# Patient Record
Sex: Male | Born: 2000 | Race: White | Hispanic: No | Marital: Single | Smoking: Never smoker
Health system: Southern US, Community
[De-identification: ages and names within clinical notes are randomized; demographics above are authoritative.]

## PROBLEM LIST (undated history)

## (undated) DIAGNOSIS — F419 Anxiety disorder, unspecified: Secondary | ICD-10-CM

## (undated) DIAGNOSIS — F909 Attention-deficit hyperactivity disorder, unspecified type: Secondary | ICD-10-CM

## (undated) HISTORY — DX: Attention-deficit hyperactivity disorder, unspecified type: F90.9

## (undated) HISTORY — DX: Anxiety disorder, unspecified: F41.9

---
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
## 2020-01-04 ENCOUNTER — Ambulatory Visit (INDEPENDENT_AMBULATORY_CARE_PROVIDER_SITE_OTHER): Payer: 59 | Admitting: Psychiatry

## 2020-01-04 ENCOUNTER — Encounter (HOSPITAL_COMMUNITY): Payer: Self-pay | Admitting: Psychiatry

## 2020-01-04 ENCOUNTER — Other Ambulatory Visit: Payer: Self-pay

## 2020-01-04 DIAGNOSIS — F330 Major depressive disorder, recurrent, mild: Secondary | ICD-10-CM

## 2020-01-04 DIAGNOSIS — R4589 Other symptoms and signs involving emotional state: Secondary | ICD-10-CM

## 2020-01-04 NOTE — Progress Notes (Signed)
Virtual Visit via Video Note  I connected with Howard Herman on 01/04/20 at 12:00 PM EST by a video enabled telemedicine application and verified that I am speaking with the correct person using two identifiers.  Location: Patient: Patient Home Provider: Home Office   History of Present Illness: MDD and Difficulty Coping  Observations/Objective: Counselor met with Patient in the context of Group Therapy, via Webex. Counselor prompted Patient to share updates on coping skill application and individual therapeutic process in management of mental health. Howard Herman reports that he is maintaining mood and daily functioning well over the past week. He continues with individual therapy and medication management. Howard Herman reports that he is coping better with the recent loss of relationship. Counselor prompted Patient to share areas of concern, challenges and identify barriers to meeting current goals. Howard Herman shared about a difficult situation he is in, requesting feedback on how to best interact with others in a healthy and respectful manner. He noted progress made in decision making and less over-thinking. Topics covered: handling difficult news, optimistic actions, communicating needs, setting boundaries, positive self-talk, and self-advocacy. Patient engaged in discussion, provided feedback for others within the group and took note of additional strategies reviewed and discussed.   Assessment and Plan: Counselor recommends client continue following treatment plan goals, following crisis plan and following up with behavioral health and medical providers as needed. Patient is welcome to join group at next session.   Follow Up Instructions: Counselor to provide link for next session via Webex platform. The patient was advised to call back or seek an in-person evaluation if the symptoms worsen or if the condition fails to improve as anticipated.  I provided 60 minutes of non-face-to-face time during this  encounter.   Lise Auer, LCSW

## 2020-05-02 ENCOUNTER — Encounter (HOSPITAL_COMMUNITY): Payer: Self-pay | Admitting: Psychiatry

## 2020-05-02 ENCOUNTER — Other Ambulatory Visit: Payer: Self-pay

## 2020-05-02 ENCOUNTER — Ambulatory Visit (INDEPENDENT_AMBULATORY_CARE_PROVIDER_SITE_OTHER): Payer: 59 | Admitting: Psychiatry

## 2020-05-02 DIAGNOSIS — F330 Major depressive disorder, recurrent, mild: Secondary | ICD-10-CM

## 2020-05-02 NOTE — Progress Notes (Signed)
Virtual Visit via Video Note  I connected with Howard Herman on 05/02/20 at 12:00 PM EDT by a video enabled telemedicine application and verified that I am speaking with the correct person using two identifiers.  Location: Patient: Patient Home Provider: Home Office   History of Present Illness: MDD  Observations/Objective: Counselor met with Patient in the context of Group Therapy, via Webex. Counselor prompted Patient to share updates on coping skill application and individual therapeutic process in management of mental health. Patient reported that he has been implementing self-compassion practices, attempting to show self-love. Counselor prompted Patient to share areas of concern, challenges and identify barriers to meeting current goals. Patient noted that he is struggling with launching to independence, renegotiating roles/expectations with parents and is anxious about starting college in the fall. Topics covered: stage of life issues, self-doubt, self-awareness, communicating needs, setting boundaries, positive self-talk, and self-advocacy. Patient engaged in discussion, provided feedback for others within the group and took note of additional strategies reviewed and discussed.   Assessment and Plan: Counselor recommends client continue following treatment plan goals, following crisis plan and following up with behavioral health and medical providers as needed. Patient is welcome to join group at next session.   Follow Up Instructions: Counselor to provide link for next session via Webex platform. The patient was advised to call back or seek an in-person evaluation if the symptoms worsen or if the condition fails to improve as anticipated.  I provided 60 minutes of non-face-to-face time during this encounter.   Lise Auer, LCSW

## 2020-05-09 ENCOUNTER — Other Ambulatory Visit: Payer: Self-pay

## 2020-05-09 ENCOUNTER — Ambulatory Visit (HOSPITAL_COMMUNITY): Payer: 59 | Admitting: Psychiatry

## 2020-06-06 ENCOUNTER — Encounter (HOSPITAL_COMMUNITY): Payer: Self-pay | Admitting: Psychiatry

## 2020-06-06 ENCOUNTER — Other Ambulatory Visit: Payer: Self-pay

## 2020-06-06 ENCOUNTER — Ambulatory Visit (INDEPENDENT_AMBULATORY_CARE_PROVIDER_SITE_OTHER): Payer: 59 | Admitting: Psychiatry

## 2020-06-06 DIAGNOSIS — F330 Major depressive disorder, recurrent, mild: Secondary | ICD-10-CM | POA: Diagnosis not present

## 2020-06-06 NOTE — Progress Notes (Signed)
Virtual Visit via Video Note  I connected with Howard Herman on 06/06/20 at 12:00 PM EDT by a video enabled telemedicine application and verified that I am speaking with the correct person using two identifiers.  Location: Patient: Patient Home Provider: Home Office   History of Present Illness: MDD  Observations/Objective: Counselor met with Patient in the context of Group Therapy, via Webex. Counselor prompted Patient to share updates on coping skill application and individual therapeutic process in management of mental health. Client reports that he has "been doing good", noting ability to hold a job in his field of interest, preparing for starting college, and reports not avoiding himself as much. Counselor prompted Patient to share areas of concern, challenges and identify barriers to meeting current goals. Client noted work to being more content with being alone and sitting in discomfort, allowing himself to feel and experience complex feelings. He notes making conscious decisions to apply healthy coping skills. Topics covered: finding purpose, sitting with emotions in a healthy way, redefining self, overall wellness, recovery and grief and loss issues. Patient engaged in discussion, provided feedback for others within the group and took note of additional strategies reviewed and discussed.   Assessment and Plan: Counselor recommends client continue following treatment plan goals, following crisis plan and following up with behavioral health and medical providers as needed. Patient is welcome to join group at next session.   Follow Up Instructions: Counselor to provide link for next session via Webex platform. The patient was advised to call back or seek an in-person evaluation if the symptoms worsen or if the condition fails to improve as anticipated.  I provided 60 minutes of non-face-to-face time during this encounter.   Lise Auer, LCSW

## 2020-06-13 ENCOUNTER — Encounter (HOSPITAL_COMMUNITY): Payer: Self-pay | Admitting: Psychiatry

## 2020-06-13 ENCOUNTER — Other Ambulatory Visit: Payer: Self-pay

## 2020-06-13 ENCOUNTER — Ambulatory Visit (HOSPITAL_COMMUNITY): Payer: 59 | Admitting: Psychiatry

## 2020-06-13 DIAGNOSIS — F330 Major depressive disorder, recurrent, mild: Secondary | ICD-10-CM

## 2020-06-13 NOTE — Progress Notes (Signed)
Client contacted Counselor to make aware that he would be unable to join group today, as he was helping a friend in turmoil. He plans to attend future sessions and reports being well otherwise.   Hilbert Odor, LCSW

## 2020-07-04 ENCOUNTER — Ambulatory Visit (HOSPITAL_COMMUNITY): Payer: 59 | Admitting: Psychiatry

## 2020-07-04 NOTE — Progress Notes (Signed)
Client was unable to attend today's session due to work obligations. He plans to return to future sessions.

## 2020-07-25 ENCOUNTER — Other Ambulatory Visit: Payer: Self-pay

## 2020-07-25 ENCOUNTER — Encounter (HOSPITAL_COMMUNITY): Payer: Self-pay | Admitting: Psychiatry

## 2020-07-25 ENCOUNTER — Ambulatory Visit (INDEPENDENT_AMBULATORY_CARE_PROVIDER_SITE_OTHER): Payer: 59 | Admitting: Psychiatry

## 2020-07-25 DIAGNOSIS — F330 Major depressive disorder, recurrent, mild: Secondary | ICD-10-CM

## 2020-07-25 NOTE — Progress Notes (Signed)
Virtual Visit via Video Note  I connected with Howard Herman on 07/25/20 at 12:00 PM EDT by a video enabled telemedicine application and verified that I am speaking with the correct person using two identifiers.  Location: Patient: Patient Home Provider: Home Office   GROUP GOAL: Client will attend IOP Aftercare Group Therapy 2-4x a month to connect with peers and to apply strategies discussed to improve mental health condition.  History of Present Illness: MDD  Observations/Objective: Counselor met with Patient in the context of Group Therapy, via Webex. Counselor prompted Patient to share updates on coping skill application and individual therapeutic process in management of mental health. Client reported on positive application of skills with desired outcomes. Client used problem-solving, relaxation, grounding and cognitive coping skills to handle a series of difficult situations/stressors. Counselor celebrated Client's growth since start of treatment. 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 Client presented as content, happy. Counselor prompted Patient to share areas of concern, challenges and identify barriers to meeting current goals. Client discussed recent awareness that parents are getting divorced, concern for siblings and transitioning to college. Topics covered: suicidal ideation, prevention/crisis plans, and utilizing support systems. Patient engaged in discussion, provided feedback for others within the group and took note of additional strategies reviewed and discussed.   Assessment and Plan: Counselor recommends client continue following treatment plan goals, following crisis plan and following up with behavioral health and medical providers as needed. Patient is welcome to join group at next session.   Follow Up Instructions: Counselor to provide link for next session via  Webex platform. The patient was advised to call back or seek an in-person evaluation if the symptoms worsen or if the condition fails to improve as anticipated.  I provided 60 minutes of non-face-to-face time during this encounter.   Lise Auer, LCSW
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
## 2022-01-13 ENCOUNTER — Encounter (HOSPITAL_BASED_OUTPATIENT_CLINIC_OR_DEPARTMENT_OTHER): Payer: Self-pay | Admitting: Emergency Medicine

## 2022-01-13 ENCOUNTER — Emergency Department (HOSPITAL_BASED_OUTPATIENT_CLINIC_OR_DEPARTMENT_OTHER): Payer: BLUE CROSS/BLUE SHIELD

## 2022-01-13 ENCOUNTER — Emergency Department (HOSPITAL_BASED_OUTPATIENT_CLINIC_OR_DEPARTMENT_OTHER)
Admission: EM | Admit: 2022-01-13 | Discharge: 2022-01-13 | Disposition: A | Discharge status: Home / Self Care | Payer: BLUE CROSS/BLUE SHIELD | Attending: Emergency Medicine | Admitting: Emergency Medicine

## 2022-01-13 ENCOUNTER — Other Ambulatory Visit: Payer: Self-pay

## 2022-01-13 DIAGNOSIS — D72829 Elevated white blood cell count, unspecified: Secondary | ICD-10-CM | POA: Insufficient documentation

## 2022-01-13 DIAGNOSIS — R112 Nausea with vomiting, unspecified: Secondary | ICD-10-CM | POA: Diagnosis not present

## 2022-01-13 DIAGNOSIS — R197 Diarrhea, unspecified: Secondary | ICD-10-CM | POA: Insufficient documentation

## 2022-01-13 DIAGNOSIS — Z20822 Contact with and (suspected) exposure to covid-19: Secondary | ICD-10-CM | POA: Diagnosis not present

## 2022-01-13 DIAGNOSIS — R1084 Generalized abdominal pain: Secondary | ICD-10-CM | POA: Diagnosis not present

## 2022-01-13 LAB — CBC
HCT: 48 % (ref 39.0–52.0)
Hemoglobin: 17.8 g/dL — ABNORMAL HIGH (ref 13.0–17.0)
MCH: 31.2 pg (ref 26.0–34.0)
MCHC: 37.1 g/dL — ABNORMAL HIGH (ref 30.0–36.0)
MCV: 84.2 fL (ref 80.0–100.0)
Platelets: 193 10*3/uL (ref 150–400)
RBC: 5.7 MIL/uL (ref 4.22–5.81)
RDW: 12.6 % (ref 11.5–15.5)
WBC: 17.4 10*3/uL — ABNORMAL HIGH (ref 4.0–10.5)
nRBC: 0 % (ref 0.0–0.2)

## 2022-01-13 LAB — COMPREHENSIVE METABOLIC PANEL
ALT: 18 U/L (ref 0–44)
AST: 16 U/L (ref 15–41)
Albumin: 5.4 g/dL — ABNORMAL HIGH (ref 3.5–5.0)
Alkaline Phosphatase: 53 U/L (ref 38–126)
Anion gap: 15 (ref 5–15)
BUN: 14 mg/dL (ref 6–20)
CO2: 22 mmol/L (ref 22–32)
Calcium: 10.6 mg/dL — ABNORMAL HIGH (ref 8.9–10.3)
Chloride: 100 mmol/L (ref 98–111)
Creatinine, Ser: 0.92 mg/dL (ref 0.61–1.24)
GFR, Estimated: >60 mL/min (ref >60)
Glucose, Bld: 131 mg/dL — ABNORMAL HIGH (ref 70–99)
Potassium: 4.1 mmol/L (ref 3.5–5.1)
Sodium: 137 mmol/L (ref 135–145)
Total Bilirubin: 1.1 mg/dL (ref 0.3–1.2)
Total Protein: 8.5 g/dL — ABNORMAL HIGH (ref 6.5–8.1)

## 2022-01-13 LAB — RESP PANEL BY RT-PCR (FLU A&B, COVID) ARPGX2
Influenza A by PCR: NEGATIVE
Influenza B by PCR: NEGATIVE
SARS Coronavirus 2 by RT PCR: NEGATIVE

## 2022-01-13 LAB — LIPASE, BLOOD: Lipase: 11 U/L (ref 11–51)

## 2022-01-13 MED ORDER — PROCHLORPERAZINE EDISYLATE 10 MG/2ML IJ SOLN
10.0000 mg | Freq: Once | INTRAMUSCULAR | Status: AC
  Administered 2022-01-13: 10 mg via INTRAVENOUS
  Filled 2022-01-13: qty 2

## 2022-01-13 MED ORDER — ONDANSETRON 4 MG PO TBDP: 4.0000 mg | ORAL_TABLET | Freq: Three times a day (TID) | ORAL | 0 refills | Status: AC | PRN

## 2022-01-13 MED ORDER — SODIUM CHLORIDE 0.9 % IV BOLUS
1000.0000 mL | Freq: Once | INTRAVENOUS | Status: AC
  Administered 2022-01-13: 1000 mL via INTRAVENOUS

## 2022-01-13 MED ORDER — ONDANSETRON HCL 4 MG/2ML IJ SOLN
4.0000 mg | Freq: Once | INTRAMUSCULAR | Status: AC | PRN
  Administered 2022-01-13: 4 mg via INTRAVENOUS
  Filled 2022-01-13: qty 2

## 2022-01-13 MED ORDER — IOHEXOL 300 MG/ML  SOLN
100.0000 mL | Freq: Once | INTRAMUSCULAR | Status: AC | PRN
  Administered 2022-01-13: 100 mL via INTRAVENOUS

## 2022-01-13 NOTE — ED Triage Notes (Signed)
Pt endorses generalized abd pain, n/v/d since 3 this morning. Pt just got here from [place]. Pain in abd is intermittent and cramping.  ?

## 2022-01-13 NOTE — ED Notes (Signed)
Patient called, currently in restroom. ?

## 2022-01-13 NOTE — ED Provider Notes (Signed)
[facility] EMERGENCY DEPT Provider Note   CSN: [id number] Arrival date & time: 01/13/22  1046     History  Chief Complaint  Patient presents with   Abdominal Pain    Howard Herman is a 21 y.o. male.  Patient with no pertinent past medical history presents today with complaints of abdominal pain nausea, vomiting, and diarrhea.  He states that same began this morning around 3 AM and has been persistent since. He states that he felt normal when he went to bed last night. States that he has had 7-10 episodes of nonbloody nonbilious emesis accompanied with watery diarrhea today. He also endorses associated severe diffuse abdominal pain which he describes as cramping and throughout his abdomen. He denies any known sick contacts or eating anything unusual. Denies any recent alcohol, marijuana or other recreational drug use. Denies any fevers, chills, hematochezia, melena, dysuria, or hematuria. No hx of abdominal surgeries.  The history is provided by the patient. No language interpreter was used.  Abdominal Pain Associated symptoms: diarrhea, nausea and vomiting   Associated symptoms: no chills, no dysuria, no fever and no hematuria       Home Medications Prior to Admission medications   Medication Sig Start Date End Date Taking? Authorizing Provider  acetaminophen (TYLENOL) 500 MG tablet Take 1 tablet (500 mg total) by mouth every 6 (six) hours as needed. 09/23/19   Law, Waylan Boga, PA-C  desvenlafaxine (PRISTIQ) 50 MG 24 hr tablet Take 50 mg by mouth daily.    [provider]  ibuprofen (ADVIL) 600 MG tablet Take 1 tablet (600 mg total) by mouth every 6 (six) hours as needed. 09/23/19   Law, Waylan Boga, PA-C  methocarbamol (ROBAXIN) 500 MG tablet Take 1 tablet (500 mg total) by mouth 2 (two) times daily. 09/23/19   Law, Waylan Boga, PA-C  mirtazapine (REMERON) 7.5 MG tablet Take 1 tablet (7.5 mg total) by mouth at bedtime. For sleep 09/01/19   Armandina Stammer I,  NP  venlafaxine XR (EFFEXOR-XR) 75 MG 24 hr capsule Take 1 capsule (75 mg total) by mouth daily. For depression 09/02/19   Armandina Stammer I, NP      Allergies    Clomipramine    Review of Systems   Review of Systems  Constitutional:  Negative for chills and fever.  HENT:  Negative for congestion.   Gastrointestinal:  Positive for abdominal pain, diarrhea, nausea and vomiting. Negative for abdominal distention, anal bleeding and blood in stool.  Genitourinary:  Negative for decreased urine volume, difficulty urinating, dysuria, enuresis, flank pain, hematuria, penile discharge, penile pain, penile swelling, scrotal swelling, testicular pain and urgency.  Neurological:  Negative for headaches.  All other systems reviewed and are negative.  Physical Exam Updated Vital Signs BP (!) 135/91 (BP Location: Right Arm)    Pulse 92    Temp 97.9 F (36.6 C)    Resp 18    Ht 5\' 11"  (1.803 m)    Wt 81 kg    SpO2 100%    BMI 24.91 kg/m  Physical Exam Vitals and nursing note reviewed.  Constitutional:      General: He is not in acute distress.    Appearance: Normal appearance. He is well-developed and normal weight. He is not ill-appearing, toxic-appearing or diaphoretic.     Comments: Patient somewhat ill appearing actively vomiting in room in some discomfort  HENT:     Head: Normocephalic and atraumatic.  Cardiovascular:     Rate and Rhythm: Normal  rate and regular rhythm.     Heart sounds: Normal heart sounds.  Pulmonary:     Effort: Pulmonary effort is normal. No respiratory distress.     Breath sounds: Normal breath sounds.  Abdominal:     General: Abdomen is flat. Bowel sounds are increased.     Palpations: Abdomen is soft.     Tenderness: There is generalized abdominal tenderness. There is no right CVA tenderness, left CVA tenderness, guarding or rebound. Negative signs include Murphy's sign and Rovsing's sign.  Musculoskeletal:        General: Normal range of motion.     Cervical back:  Normal range of motion.  Skin:    General: Skin is warm and dry.  Neurological:     General: No focal deficit present.     Mental Status: He is alert.  Psychiatric:        Mood and Affect: Mood normal.        Behavior: Behavior normal.    ED Results / Procedures / Treatments   Labs (all labs ordered are listed, but only abnormal results are displayed) Labs Reviewed  COMPREHENSIVE METABOLIC PANEL - Abnormal; Notable for the following components:      Result Value   Glucose, Bld 131 (*)    Calcium 10.6 (*)    Total Protein 8.5 (*)    Albumin 5.4 (*)    All other components within normal limits  CBC - Abnormal; Notable for the following components:   WBC 17.4 (*)    Hemoglobin 17.8 (*)    MCHC 37.1 (*)    All other components within normal limits  RESP PANEL BY RT-PCR (FLU A&B, COVID) ARPGX2  LIPASE, BLOOD    EKG None  Radiology CT ABDOMEN PELVIS W CONTRAST  Result Date: 01/13/2022 CLINICAL DATA:  Abdominal pain with nausea, vomiting and diarrhea since this morning. EXAM: CT ABDOMEN AND PELVIS WITH CONTRAST TECHNIQUE: Multidetector CT imaging of the abdomen and pelvis was performed using the standard protocol following bolus administration of intravenous contrast. RADIATION DOSE REDUCTION: This exam was performed according to the departmental dose-optimization program which includes automated exposure control, adjustment of the mA and/or kV according to patient size and/or use of iterative reconstruction technique. CONTRAST:  OMNIPAQUE IOHEXOL 300 MG/ML  SOLN COMPARISON:  None. FINDINGS: Lower chest: Clear lung bases. Hepatobiliary: No focal liver abnormality is seen. No gallstones, gallbladder wall thickening, or biliary dilatation. Pancreas: Unremarkable. No pancreatic ductal dilatation or surrounding inflammatory changes. Spleen: Top normal in size, 13 cm.  Normal attenuation.  No mass. Adrenals/Urinary Tract: Adrenal glands are unremarkable. Kidneys are normal, without  renal calculi, focal lesion, or hydronephrosis. Bladder is unremarkable. Stomach/Bowel: Stomach is within normal limits. Appendix appears normal. No evidence of bowel wall thickening, distention, or inflammatory changes. Vascular/Lymphatic: No significant vascular findings are present. No enlarged abdominal or pelvic lymph nodes. Reproductive: Unremarkable. Other: No abdominal wall hernia or abnormality. No abdominopelvic ascites. Musculoskeletal: No fracture or acute finding.  No bone lesion. IMPRESSION: 1. Normal enhanced CT scan of the abdomen and pelvis. Electronically Signed   By: Amie Portland M.D.   On: 01/13/2022 13:49    Procedures Procedures    Medications Ordered in ED Medications  ondansetron (ZOFRAN) injection 4 mg (4 mg Intravenous Given 01/13/22 1122)  sodium chloride 0.9 % bolus 1,000 mL (0 mLs Intravenous Stopped 01/13/22 1323)  ondansetron (ZOFRAN) injection 4 mg (4 mg Intravenous Given 01/13/22 1222)  prochlorperazine (COMPAZINE) injection 10 mg (10 mg Intravenous Given  01/13/22 1318)  sodium chloride 0.9 % bolus 1,000 mL (0 mLs Intravenous Stopped 01/13/22 1424)  iohexol (OMNIPAQUE) 300 MG/ML solution 100 mL (100 mLs Intravenous Contrast Given 01/13/22 1335)    ED Course/ Medical Decision Making/ A&P                           Medical Decision Making Amount and/or Complexity of Data Reviewed Labs: ordered. Radiology: ordered.  Risk Prescription drug management.   This patient presents to the ED for concern of abdominal pain, nausea, vomiting, diarrhea, this involves an extensive number of treatment options, and is a complaint that carries with it a high risk of complications and morbidity.  The differential diagnosis includes cannabinoid hyperemesis, viral gastroenteritis, appendicitis, cholecystitis, constipation. This is not a comprehensive list   Co morbidities that complicate the patient evaluation  none   Lab Tests:  I Ordered, and personally interpreted labs.  The  pertinent results include:  Leukocytosis at 17.4   Imaging Studies ordered:  I ordered imaging studies including CT abdomen pelvis with contrast  I independently visualized and interpreted imaging which showed  1. Normal enhanced CT scan of the abdomen and pelvis. I agree with the radiologist interpretation    Medicines ordered and prescription drug management:  I ordered medication including zofran, compazine, and fluids  for nausea, vomiting, and dehydration  Reevaluation of the patient after these medicines showed that the patient resolved I have reviewed the patients home medicines and have made adjustments as needed   Patient presents today with diffuse abdominal pain, nausea, vomiting, and diarrhea that awoke him from sleep around 3 am this morning. Patient is nontoxic, nonseptic appearing, in no apparent distress.  Patient's pain and other symptoms adequately managed in emergency department.  Fluid bolus given.  Labs, imaging and vitals reviewed.  Patient does not meet the SIRS or Sepsis criteria.  On repeat exam patient does not have a surgical abdomin and there are no peritoneal signs.  No indication of appendicitis, bowel obstruction, bowel perforation, cholecystitis, diverticulitis. Upon reevaluation, patient no longer experiencing nausea, vomiting, or diarrhea. He is able to tolerate po intake with subsequent episodes of nausea or vomiting. Suspect patients symptoms to be due to viral gastroenteritis. Patient in agreement with same. Patient discharged home with symptomatic treatment and given strict instructions for follow-up with their primary care physician.  I have also discussed reasons to return immediately to the ER.  Patient expresses understanding and agrees with plan. Discharged in stable condition.   This is a shared visit with supervising physician Dr. Dalene Seltzer who has independently evaluated patient & provided guidance in evaluation/management/disposition, in agreement  with care    Final Clinical Impression(s) / ED Diagnoses Final diagnoses:  Nausea vomiting and diarrhea    Rx / DC Orders ED Discharge Orders          Ordered    ondansetron (ZOFRAN-ODT) 4 MG disintegrating tablet  Every 8 hours PRN        01/13/22 1410          An After Visit Summary was printed and given to the patient.     Vear Clock 01/14/22 Frutoso Chase, MD 01/15/22 [phone]

## 2022-01-13 NOTE — ED Notes (Signed)
Pt denies ability to provide urine sample at this time. ?

## 2022-01-13 NOTE — ED Notes (Signed)
Pt was asked for a urine sample but didn't use the urinal, reminded pt if need to use bathroom again a sample is needed ?

## 2022-01-13 NOTE — ED Notes (Signed)
Maralyn Sago, PA-C in room w/pt now. ?
